# Patient Record
Sex: Male | Born: 1955
Health system: Southern US, Community
[De-identification: ages and names within clinical notes are randomized; demographics above are authoritative.]

## PROBLEM LIST (undated history)

## (undated) DIAGNOSIS — E7800 Pure hypercholesterolemia, unspecified: Secondary | ICD-10-CM

## (undated) DIAGNOSIS — K219 Gastro-esophageal reflux disease without esophagitis: Secondary | ICD-10-CM

## (undated) HISTORY — DX: Gastro-esophageal reflux disease without esophagitis: K21.9

## (undated) HISTORY — PX: OTHER SURGICAL HISTORY: SHX169

## (undated) HISTORY — DX: Pure hypercholesterolemia, unspecified: E78.00

---

## 2005-10-03 ENCOUNTER — Ambulatory Visit (HOSPITAL_COMMUNITY): Admission: RE | Admit: 2005-10-03 | Discharge: 2005-10-03 | Payer: Self-pay | Admitting: Orthopedic Surgery

## 2006-01-21 ENCOUNTER — Emergency Department (HOSPITAL_COMMUNITY): Admission: EM | Admit: 2006-01-21 | Discharge: 2006-01-21 | Payer: Self-pay | Admitting: Family Medicine

## 2006-10-28 ENCOUNTER — Ambulatory Visit (HOSPITAL_COMMUNITY): Admission: RE | Admit: 2006-10-28 | Discharge: 2006-10-28 | Payer: Self-pay | Admitting: Gastroenterology

## 2006-10-28 ENCOUNTER — Encounter (INDEPENDENT_AMBULATORY_CARE_PROVIDER_SITE_OTHER): Payer: Self-pay | Admitting: *Deleted

## 2007-06-30 ENCOUNTER — Encounter (INDEPENDENT_AMBULATORY_CARE_PROVIDER_SITE_OTHER): Payer: Self-pay | Admitting: Gastroenterology

## 2007-06-30 ENCOUNTER — Ambulatory Visit (HOSPITAL_COMMUNITY): Admission: RE | Admit: 2007-06-30 | Discharge: 2007-06-30 | Payer: Self-pay | Admitting: Gastroenterology

## 2008-08-18 ENCOUNTER — Ambulatory Visit (HOSPITAL_COMMUNITY): Admission: RE | Admit: 2008-08-18 | Discharge: 2008-08-18 | Payer: Self-pay | Admitting: General Surgery

## 2009-09-03 ENCOUNTER — Emergency Department (HOSPITAL_COMMUNITY): Admission: EM | Admit: 2009-09-03 | Discharge: 2009-09-03 | Payer: Self-pay | Admitting: Family Medicine

## 2011-04-30 NOTE — Op Note (Signed)
Jonathan Wallace, Jonathan Wallace               ACCOUNT NO.:  [id number]   MEDICAL RECORD NO.:  [id number]          PATIENT TYPE:  AMB   LOCATION:  SDS                          FACILITY:  [facility]   PHYSICIAN:  Lennie Muckle, MD      DATE OF BIRTH:  1956/11/11   DATE OF PROCEDURE:  08/18/2008  DATE OF DISCHARGE:  08/18/2008                               OPERATIVE REPORT   PREOPERATIVE DIAGNOSIS:  Left inguinal hernia.   POSTOPERATIVE DIAGNOSIS:  Left inguinal hernia.   PROCEDURE:  Laparoscopic left inguinal hernia repair.   SURGEON:  Amber L. Freida Busman, MD   ASSISTANT:  None.   ANESTHESIA:  General endotracheal.   FINDINGS:  A large indirect hernia.   COMPLICATIONS:  No immediate complications.   DRAINS:  No drains were placed.   INDICATIONS FOR PROCEDURE:  Jonathan Wallace is a 55 year old male who had had  bulging of his left groin with lifting weight.  He had burning-type of  pain with occasional sharp pain with coughing and sneezing.  He has to  refrain from heavy exertional activity and he has had some improvement  in his symptoms, but would like to have repair of his left inguinal  hernia.  Informed consent was obtained from the patient after explaining  risk of the procedure and offering both the laparoscopic and open  repair.  I discussed with the patient the risk included but not limited  to bleeding, infection, hernia recurrence, injury to spermatic cord and  vessels etc.  He was given reading material and informed consent was  obtained prior to the procedure.   DETAILS OF PROCEDURE:  Jonathan Wallace was identified in the preoperative  holding area.  He received 1 g of Kefzol and was taken to the operating  room.  He had his left groin marked by me preoperatively.  Once in the  operating room, he was placed in a supine position.  After  administration of general endotracheal anesthesia, a Foley catheter was  placed.  His abdomen and groin were prepped and draped in the usual  sterile  fashion.  A time-out procedure indicating the patient and  procedure were performed.  An incision was placed at the umbilicus after  anesthetizing the skin with 0.25% Marcaine.  I identified the anterior  rectus fascia.  This was incised with #1 blade.  I then retracted the  rectus muscle on the left laterally and finger dissected the  preperitoneal space.  The pacemaker plus device was then placed into  preperitoneal space.  Using a 30 degree 10-mm camera, the balloon was  insufflated in the pacemaker plus.  After adequately insufflating the  balloon, I waited approximately 2 minute for adequate capillary refill.  I then retracted the balloon slightly and refilled the balloon one more  time.  The balloon was then removed.  Laparoscopic port was secured  after insufflating the balloon on the port.  Two 5-mm ports were placed  in the midline under visualization with camera.  I then began dissecting  laterally using blunt dissectors on the right abdominal wall.  The  peritoneum  was densely adhered to the muscle on the right.  A small  retroperitoneum was occurred with dissection.  I did close it with a PDS  Endoloop.  I then proceeded my dissection medially down towards the  internal ring.  I dissected the peritoneum in the vicinity of the  internal ring and identified the vas deferens, as well as spermatic cord  vessels.  I continued dissecting the hernia sac away from the spermatic  cord and vessels.  This was able to be fully dissected away.  There was  a minimum amount of bleeding in the area.  I then pushed the peritoneum  and the sac away from the vas deferens and the vessels.  I then  carefully dissected around the area of the Cooper ligament by keeping  the internal iliac vessels in view.  I then placed 3 x 6 piece of  Ultrapro mesh into the preperitoneal space.  This was secured with the  ProTack device at [facility] ligament and secured it around the internal  ring and then laterally  by palpating the ProTack device.  There was some  escape of carbon dioxide into the abdominal cavity from the rent in the  peritoneum.  However, I placed the mesh in good position and allowed the  peritoneum to retract to insufflate around the mesh and holding the  inferior edges in position.  I then released the pneumoperitoneum,  attempted to remove as much gas from the abdominal cavity as possible.  I removed the port at the umbilical region.  The fascia was closed with  0 Vicryl suture.  Skin was closed with 4-0 Monocryl.  The patient was  extubated, has Foley catheter removed and he is transferred to  postanesthesia care unit in stable condition.  He will be discharged  home with Percocet, #45 dispensed.  He will follow up with me in  approximately 3 weeks' time.      Lennie Muckle, MD  Electronically Signed     ALA/MEDQ  D:  08/18/2008  T:  08/19/2008  Job:  [id number]   cc:   Tally Joe, M.D.

## 2011-04-30 NOTE — Op Note (Signed)
Jonathan Wallace, Jonathan Wallace               ACCOUNT NO.:  [id number]   MEDICAL RECORD NO.:  [id number]          PATIENT TYPE:  AMB   LOCATION:  ENDO                         FACILITY:  [facility]   PHYSICIAN:  Petra Kuba, M.D.    DATE OF BIRTH:  [birth year]   DATE OF PROCEDURE:  06/30/2007  DATE OF DISCHARGE:                               OPERATIVE REPORT   PROCEDURE:  Flex sigmoidoscopy with hot biopsy.   INDICATIONS:  Want to recheck a sessile rectal polyp which was removed  last year to make sure no residual.  Consent was signed after risks,  benefits, methods, options thoroughly discussed multiple times in the  past.  No sedation was done.   PROCEDURE:  Rectal inspection pertinent for a very small hemorrhoids.  Digital exam was negative.  The video pediatric colonoscope was  inserted.  The rectum was thoroughly evaluated.  No abnormality was seen  on straight visualization.  However, on retroflex visualization there  was possibly an area of residual polyp.  The scope was straightened, we  went ahead and advanced to 40 cm.  Other than a rare left-sided  diverticula, no other abnormalities were seen.  The scope was slowly  withdrawn back to the rectum and again was evaluated on straight and  retroflex visualization.  We could not see the area on straight  visualization; however, on retroflexion the small sessile probable  residual area was confirmed and we went ahead and hot biopsied it x2.  There was no signs of bleeding and he did not have any pain on this  maneuver.  The scope was straightened and straight visualization, we  could now see the cautery mark but no obvious residual polyp. Anorectal  pull-through did not reveal any additional findings.  We went ahead and  slowly advanced to the rectosigmoid junction.  Air was suctioned, scope  removed.  The patient tolerated the procedure well.  There was no  obvious immediate complication.   ENDOSCOPIC DIAGNOSIS:  1. Small internal external  hemorrhoids.  2. Questionable tiny residual just above the anorectal junction seen      on retroflexion only, status post hot biopsy x2.  3. Rare left-sided diverticula.  4. Otherwise within normal limits to 40 cm.   PLAN:  Await pathology.  Hold aspirin and nonsteroidals for a week, call  me p.r.n. otherwise depending on path will discuss repeat colon  screening and probably proceed with the sedated colonoscopy next time.           ______________________________  Petra Kuba, M.D.     MEM/MEDQ  D:  06/30/2007  T:  07/01/2007  Job:  [id number]   cc:   Royetta Crochet, MD  Fax: [phone]

## 2011-05-03 NOTE — Op Note (Signed)
NAMETREXTON, Jonathan Wallace               ACCOUNT NO.:  [id number]   MEDICAL RECORD NO.:  [id number]          PATIENT TYPE:  AMB   LOCATION:  ENDO                         FACILITY:  [facility]   PHYSICIAN:  Petra Kuba, M.D.    DATE OF BIRTH:  07-16-1956   DATE OF PROCEDURE:  10/28/2006  DATE OF DISCHARGE:                                 OPERATIVE REPORT   PROCEDURE:  Colonoscopy and polypectomy.   INDICATIONS:  Screening.  Consent was signed after risks, benefits, methods,  options thoroughly discussed prior to any premeds given and in the office  with my nurse.   MEDICINES USED:  Fentanyl 125 mcg, Versed 12.5 mg.   PROCEDURE:  Rectal inspection is pertinent for external hemorrhoids, small.  Digital exam was negative.  The video colonoscope was inserted and fairly  easily, despite tortuosity in both flexures, advanced to the cecum.  This  did require some abdominal pressure and rolling him on his back.  Other than  some scattered left-sided diverticula, no abnormalities were seen on  insertion.  The cecum was identified by the appendiceal orifice and  ileocecal valve.  In fact, the scope was inserted a short ways in the  terminal ileum which was normal.  Photo documentation was obtained.  The  scope was then slowly withdrawn.  The prep was adequate.  There was minimal  liquid stool that required washing and suctioning.  On slow withdrawal  through the colon, the cecum, ascending, transverse and majority of the  descending was normal.  He did have some scattered left-sided diverticula.  Once back in the rectum, there was a small semi-sessile very distal rectal  polyp which could be seen on both straight and retroflexed visualization.  We went ahead and did two snares of it carefully based on its proximity to  the hemorrhoidal line.  By the way on retroflexion and anorectal pull-  through he did have some small hemorrhoids.  We went ahead and carefully  snared it three times on straight  visualization.  Small pieces were  withdrawn and suctioned through the scope and collected in the trap, and one  time a different piece was snared on retroflexed visualization.  Electrocautery was applied and a small piece was withdrawn, suctioned  through the scope and collected in the trap.  The polyp seemed to be fairly  well removed, although based on its sessile nature difficult to say for sure  if completely removed.  The scope was straightened, readvanced a short ways  up the left side of the colon.  Air was suctioned, scope removed.  The  patient tolerated the procedure well.  There was no obvious immediate  complication.   ENDOSCOPIC DIAGNOSES:  1. Internal and external small hemorrhoids.  2. Very distal rectal semi-sessile polyp, status post multiple snares on      straight and retroflexed visualization.  3. A few left-sided diverticula.  4. Otherwise within normal limits to the terminal ileum.   PLAN:  Await pathology to determine future colonic screening.  Happy to see  back p.r.n.  Otherwise return to the care of  Dr. Stacie Acres for the customary  healthcare screening and maintenance.           ______________________________  Petra Kuba, M.D.     MEM/MEDQ  D:  10/28/2006  T:  10/28/2006  Job:  [id number]   cc:   Royetta Crochet, MD

## 2012-01-07 ENCOUNTER — Encounter: Payer: Self-pay | Admitting: Family Medicine

## 2012-01-07 ENCOUNTER — Ambulatory Visit (INDEPENDENT_AMBULATORY_CARE_PROVIDER_SITE_OTHER): Payer: 59 | Admitting: Family Medicine

## 2012-01-07 VITALS — BP 117/71 | HR 70 | Ht 71.0 in | Wt 200.0 lb

## 2012-01-07 DIAGNOSIS — M79673 Pain in unspecified foot: Secondary | ICD-10-CM

## 2012-01-07 DIAGNOSIS — M722 Plantar fascial fibromatosis: Secondary | ICD-10-CM

## 2012-01-07 DIAGNOSIS — M79609 Pain in unspecified limb: Secondary | ICD-10-CM

## 2012-01-07 NOTE — Progress Notes (Signed)
  Patient Name: Jonathan Wallace Date of Birth: 1956/12/08 Age: 56 y.o. Medical Record Number: [id number] Gender: male Date of Encounter: 01/07/2012  History of Present Illness:  Jonathan Wallace is a 56 y.o. very pleasant male patient who presents with the following:  Pleasant patient in no distress with history of PF syndrome. Intermittent foot pain, now doing OK. Did well with some old orthotics - 60 or 57 years old that are now broken down significantly. Has tried multiple different OTC foot modifications.   Past Medical History, Surgical History, Social History, Family History, Problem List, Medications, and Allergies have been reviewed and updated if relevant.  Review of Systems:  GEN: No fevers, chills. Nontoxic. Primarily MSK c/o today. MSK: Detailed in the HPI GI: tolerating PO intake without difficulty Neuro: No numbness, parasthesias, or tingling associated. Otherwise the pertinent positives of the ROS are noted above.    Physical Examination: Filed Vitals:   01/07/12 1457  BP: 117/71  Pulse: 70  Height: 5\' 11"  (1.803 m)  Weight: 200 lb (90.719 kg)    Body mass index is 27.89 kg/(m^2).   GEN: WDWN, NAD, Non-toxic, Alert & Oriented x 3 HEENT: Atraumatic, Normocephalic.  Ears and Nose: No external deformity. EXTR: No clubbing/cyanosis/edema NEURO: Normal gait.  PSYCH: Normally interactive. Conversant. Not depressed or anxious appearing.  Calm demeanor.   FEET: b Echymosis: no Edema: no ROM: full LE B Gait: heel toe, non-antalgic MT pain: no Callus pattern: none Lateral Mall: NT Medial Mall: NT Talus: NT Navicular: NT Cuboid: NT Calcaneous: NT Metatarsals: NT 5th MT: NT Phalanges: NT Achilles: NT Plantar Fascia: mild ttp Fat Pad: NT Peroneals: NT Post Tib: NT Great Toe: Nml motion Ant Drawer: neg ATFL: NT CFL: NT Deltoid: NT Other foot breakdown: none Long arch: mild breakdown Transverse arch: mild breakdown Hindfoot breakdown: none Sensation:  intact   Assessment and Plan: 1. Plantar fascia syndrome   2. Foot pain     Patient was fitted for a standard, cushioned, semi-rigid orthotic.  The orthotic was heated and the patient stood on the orthotic blank positioned on the orthotic stand. The patient was positioned in subtalar neutral position and 10 degrees of ankle dorsiflexion in a weight bearing stance. After molding, a stable [employer] EVA base was applied to the orthotic blank.   The blank was ground to a stable position for weight bearing. size:10 base: cork posting: none additional orthotic padding: none

## 2012-07-15 ENCOUNTER — Other Ambulatory Visit: Payer: Self-pay | Admitting: Gastroenterology

## 2012-12-16 DIAGNOSIS — I4510 Unspecified right bundle-branch block: Secondary | ICD-10-CM

## 2012-12-16 HISTORY — DX: Unspecified right bundle-branch block: I45.10

## 2018-04-24 ENCOUNTER — Ambulatory Visit
Admission: RE | Admit: 2018-04-24 | Discharge: 2018-04-24 | Disposition: A | Discharge status: Home / Self Care | Payer: BLUE CROSS/BLUE SHIELD | Source: Ambulatory Visit | Attending: Family Medicine | Admitting: Family Medicine

## 2018-04-24 ENCOUNTER — Other Ambulatory Visit: Payer: Self-pay | Admitting: Family Medicine

## 2018-04-24 DIAGNOSIS — R05 Cough: Secondary | ICD-10-CM

## 2018-04-24 DIAGNOSIS — R053 Chronic cough: Secondary | ICD-10-CM

## 2020-06-09 DIAGNOSIS — M238X1 Other internal derangements of right knee: Secondary | ICD-10-CM | POA: Diagnosis not present

## 2020-06-09 DIAGNOSIS — M2391 Unspecified internal derangement of right knee: Secondary | ICD-10-CM | POA: Diagnosis not present

## 2020-06-09 DIAGNOSIS — M25561 Pain in right knee: Secondary | ICD-10-CM | POA: Diagnosis not present

## 2020-07-28 DIAGNOSIS — Z20828 Contact with and (suspected) exposure to other viral communicable diseases: Secondary | ICD-10-CM | POA: Diagnosis not present

## 2020-08-14 DIAGNOSIS — R110 Nausea: Secondary | ICD-10-CM | POA: Diagnosis not present

## 2020-08-14 DIAGNOSIS — R142 Eructation: Secondary | ICD-10-CM | POA: Diagnosis not present

## 2020-08-14 DIAGNOSIS — R1013 Epigastric pain: Secondary | ICD-10-CM | POA: Diagnosis not present

## 2020-12-16 DIAGNOSIS — I700 Atherosclerosis of aorta: Secondary | ICD-10-CM

## 2020-12-16 HISTORY — DX: Atherosclerosis of aorta: I70.0

## 2021-01-09 DIAGNOSIS — K219 Gastro-esophageal reflux disease without esophagitis: Secondary | ICD-10-CM | POA: Diagnosis not present

## 2021-01-09 DIAGNOSIS — R110 Nausea: Secondary | ICD-10-CM | POA: Diagnosis not present

## 2021-02-20 DIAGNOSIS — K219 Gastro-esophageal reflux disease without esophagitis: Secondary | ICD-10-CM | POA: Diagnosis not present

## 2021-03-30 DIAGNOSIS — M75111 Incomplete rotator cuff tear or rupture of right shoulder, not specified as traumatic: Secondary | ICD-10-CM | POA: Diagnosis not present

## 2021-04-13 DIAGNOSIS — N529 Male erectile dysfunction, unspecified: Secondary | ICD-10-CM | POA: Diagnosis not present

## 2021-04-13 DIAGNOSIS — F3340 Major depressive disorder, recurrent, in remission, unspecified: Secondary | ICD-10-CM | POA: Diagnosis not present

## 2021-04-13 DIAGNOSIS — J309 Allergic rhinitis, unspecified: Secondary | ICD-10-CM | POA: Diagnosis not present

## 2021-04-13 DIAGNOSIS — E782 Mixed hyperlipidemia: Secondary | ICD-10-CM | POA: Diagnosis not present

## 2021-04-13 DIAGNOSIS — Z0000 Encounter for general adult medical examination without abnormal findings: Secondary | ICD-10-CM | POA: Diagnosis not present

## 2021-04-13 DIAGNOSIS — Z125 Encounter for screening for malignant neoplasm of prostate: Secondary | ICD-10-CM | POA: Diagnosis not present

## 2021-04-16 ENCOUNTER — Other Ambulatory Visit: Payer: Self-pay | Admitting: Family Medicine

## 2021-04-16 DIAGNOSIS — Z136 Encounter for screening for cardiovascular disorders: Secondary | ICD-10-CM

## 2021-04-18 DIAGNOSIS — H16223 Keratoconjunctivitis sicca, not specified as Sjogren's, bilateral: Secondary | ICD-10-CM | POA: Diagnosis not present

## 2021-04-18 DIAGNOSIS — H524 Presbyopia: Secondary | ICD-10-CM | POA: Diagnosis not present

## 2021-04-20 DIAGNOSIS — M75111 Incomplete rotator cuff tear or rupture of right shoulder, not specified as traumatic: Secondary | ICD-10-CM | POA: Diagnosis not present

## 2021-04-30 ENCOUNTER — Ambulatory Visit
Admission: RE | Admit: 2021-04-30 | Discharge: 2021-04-30 | Disposition: A | Discharge status: Home / Self Care | Payer: Medicare Other | Source: Ambulatory Visit | Attending: Family Medicine | Admitting: Family Medicine

## 2021-04-30 DIAGNOSIS — Z87891 Personal history of nicotine dependence: Secondary | ICD-10-CM | POA: Diagnosis not present

## 2021-04-30 DIAGNOSIS — Z136 Encounter for screening for cardiovascular disorders: Secondary | ICD-10-CM | POA: Diagnosis not present

## 2021-05-16 DIAGNOSIS — M25511 Pain in right shoulder: Secondary | ICD-10-CM | POA: Diagnosis not present

## 2021-05-28 DIAGNOSIS — J209 Acute bronchitis, unspecified: Secondary | ICD-10-CM | POA: Diagnosis not present

## 2021-06-04 DIAGNOSIS — M75111 Incomplete rotator cuff tear or rupture of right shoulder, not specified as traumatic: Secondary | ICD-10-CM | POA: Diagnosis not present

## 2021-06-28 ENCOUNTER — Ambulatory Visit: Payer: Medicare Other | Admitting: Cardiovascular Disease

## 2021-06-28 ENCOUNTER — Other Ambulatory Visit: Payer: Self-pay

## 2021-06-28 ENCOUNTER — Encounter: Payer: Self-pay | Admitting: Cardiovascular Disease

## 2021-06-28 VITALS — BP 130/84 | HR 62 | Ht 71.0 in | Wt 191.8 lb

## 2021-06-28 DIAGNOSIS — I700 Atherosclerosis of aorta: Secondary | ICD-10-CM

## 2021-06-28 DIAGNOSIS — E7800 Pure hypercholesterolemia, unspecified: Secondary | ICD-10-CM | POA: Diagnosis not present

## 2021-06-28 NOTE — Progress Notes (Signed)
Cardiology Office Note:    Date:  06/28/2021   ID:  Jonathan Wallace, DOB 12/31/1955, MRN [id number]  PCP:  Tally Joe, MD   [facility] Providers Cardiologist:  None     Referring MD: Tally Joe, MD   Chief Complaint  Patient presents with   Aortic atherosclerosis  Jonathan Wallace is a 65 y.o. male who is being seen today for the evaluation of asymptomatic aortic atherosclerosis detected during screening for AAA at the request of Tally Joe, MD.   History of Present Illness:    Jonathan Wallace is a 65 y.o. male with a hx of generally good health, borderline hypercholesterolemia, longstanding asymptomatic right bundle branch block, recurrent depression.  Due to his remote history of smoking (smoked half a pack a day for 20 years, quit 25 years ago) he underwent screening with a ultrasound for AAA.  The abdominal aorta was normal in caliber, but atherosclerosis was seen "throughout" the vessel.  He is therefore referred for evaluation for vascular disease.    In 2016 a right bundle branch block was incidentally noted on electrocardiography and he underwent work-up with an ECG stress test, which was normal.  He has always had borderline elevated cholesterol (total cholesterol around 200, LDL around 130, but with normal triglycerides and good HDL level (around 60).  He took Lipitor for a short period of time, following the diagnosis of RBBB, but has not been on medication since 2018.  The patient specifically denies any chest pain at rest exertion, dyspnea at rest or with exertion, orthopnea, paroxysmal nocturnal dyspnea, syncope, palpitations, focal neurological deficits, intermittent claudication, lower extremity edema, unexplained weight gain, cough, hemoptysis or wheezing.  He exercises regularly lifting weights at the gym, walking and hiking or using the treadmill.  He estimates that he gets a good 6-8 hours of exercise in a week.    Past Medical History:  Diagnosis Date    Abdominal aortic atherosclerosis (HCC) 2022   Borderline hypercholesterolemia    GERD (gastroesophageal reflux disease)    RBBB 2014    Past Surgical History:  Procedure Laterality Date   Right inguinal hernia repair Right     Current Medications: No outpatient medications have been marked as taking for the 06/28/21 encounter (Office Visit) with Thurmon Fair, MD.     Allergies:   Patient has no known allergies.   Social History   Socioeconomic History   Marital status: Married    Spouse name: Not on file   Number of children: Not on file   Years of education: Not on file   Highest education level: Not on file  Occupational History   Not on file  Tobacco Use   Smoking status: Former    Types: Cigarettes    Quit date: 12/16/1997    Years since quitting: 23.5   Smokeless tobacco: Never  Substance and Sexual Activity   Alcohol use: Not on file   Drug use: Not on file   Sexual activity: Not on file  Other Topics Concern   Not on file  Social History Narrative   Not on file   Social Determinants of Health   Financial Resource Strain: Not on file  Food Insecurity: Not on file  Transportation Needs: Not on file  Physical Activity: Not on file  Stress: Not on file  Social Connections: Not on file     Family History: The patient's family history significantly negative for early onset CAD or other cardiac illness.  His father did have a  stroke at age 37 and had PAD.  His mother had hypertension.  ROS:   Please see the history of present illness.    All other systems reviewed and are negative.  EKGs/Labs/Other Studies Reviewed:    The following studies were reviewed today: Abdominal aortic ultrasound 04/30/2021 IMPRESSION: Atherosclerotic plaque seen throughout the abdominal aorta without aneurysmal dilatation. EKG:  EKG is ordered today.  The ekg ordered today demonstrates sinus rhythm, right bundle branch block and left anterior fascicular block, no ischemic  changes, QTC 450 ms  Recent Labs: No results found for requested labs within last 8760 hours.  04/14/2021 Hemoglobin 14.5, creatinine 0.81, potassium 4.8, normal liver function tests Recent Lipid Panel No results found for: CHOL, TRIG, HDL, CHOLHDL, VLDL, LDLCALC, LDLDIRECT 04/14/2021  cholesterol 200, triglycerides 100, HDL 58, LDL 125  Risk Assessment/Calculations:     Physical Exam:    VS:  BP 130/84   Pulse 62   Ht 5\' 11"  (1.803 m)   Wt 191 lb 12.8 oz (87 kg)   SpO2 97%   BMI 26.75 kg/m     Wt Readings from Last 3 Encounters:  06/28/21 191 lb 12.8 oz (87 kg)  01/07/12 200 lb (90.7 kg)     GEN: Mildly overweight. Well nourished, well developed in no acute distress HEENT: Normal NECK: No JVD; No carotid bruits LYMPHATICS: No lymphadenopathy CARDIAC: RRR, no murmurs, rubs, gallops RESPIRATORY:  Clear to auscultation without rales, wheezing or rhonchi  ABDOMEN: Soft, non-tender, non-distended MUSCULOSKELETAL:  No edema; No deformity  SKIN: Warm and dry NEUROLOGIC:  Alert and oriented x 3 PSYCHIATRIC:  Normal affect   ASSESSMENT:    1. Aortic atherosclerosis (HCC)   2. Borderline hypercholesterolemia    PLAN:    In order of problems listed above:  Aortic atherosclerosis: Normal caliber of the aorta.  Serial studies are not indicated.  However this may be a marker of more extensive atherosclerosis and possible need for more aggressive lipid risk factor management. Hypercholesterolemia: Hard to quantify his atherosclerotic/vascular risk based on the abdominal ultrasound scan.  His LDL cholesterol is just under the threshold where we typically discuss lipid-lowering therapy in an otherwise asymptomatic patient without CAD/PAD.  We will schedule him for coronary calcium scoring.  If his coronary calcium score is elevated then I think he should receive lipid-lowering therapy to achieve a target LDL of less than 100, preferably less than 70.  If however his coronary  calcium score is low.  The focus should be on weight loss, continued physical exercise and healthy diet alone.  He does not have any other coronary risk factors since he quit smoking 25 years ago.        Medication Adjustments/Labs and Tests Ordered: Current medicines are reviewed at length with the patient today.  Concerns regarding medicines are outlined above.  Orders Placed This Encounter  Procedures   CT CARDIAC SCORING (SELF PAY ONLY)   EKG 12-Lead   No orders of the defined types were placed in this encounter.   Patient Instructions  Medication Instructions:  No changes *If you need a refill on your cardiac medications before your next appointment, please call your pharmacy*   Lab Work: None ordered If you have labs (blood work) drawn today and your tests are completely normal, you will receive your results only by: MyChart Message (if you have MyChart) OR A paper copy in the mail If you have any lab test that is abnormal or we need to change your treatment,  we will call you to review the results.   Testing/Procedures: Dr. Royann Shiversroitoru has ordered a CT coronary calcium score. This test is done at [address]. This is $99 out of pocket.   Coronary CalciumScan A coronary calcium scan is an imaging test used to look for deposits of calcium and other fatty materials (plaques) in the inner lining of the blood vessels of the heart (coronary arteries). These deposits of calcium and plaques can partly clog and narrow the coronary arteries without producing any symptoms or warning signs. This puts a person at risk for a heart attack. This test can detect these deposits before symptoms develop. Tell a health care provider about: Any allergies you have. All medicines you are taking, including vitamins, herbs, eye drops, creams, and over-the-counter medicines. Any problems you or family members have had with anesthetic medicines. Any blood disorders you have. Any  surgeries you have had. Any medical conditions you have. Whether you are pregnant or may be pregnant. What are the risks? Generally, this is a safe procedure. However, problems may occur, including: Harm to a pregnant woman and her unborn baby. This test involves the use of radiation. Radiation exposure can be dangerous to a pregnant woman and her unborn baby. If you are pregnant, you generally should not have this procedure done. Slight increase in the risk of cancer. This is because of the radiation involved in the test. What happens before the procedure? No preparation is needed for this procedure. What happens during the procedure? You will undress and remove any jewelry around your neck or chest. You will put on a hospital gown. Sticky electrodes will be placed on your chest. The electrodes will be connected to an electrocardiogram (ECG) machine to record a tracing of the electrical activity of your heart. A CT scanner will take pictures of your heart. During this time, you will be asked to lie still and hold your breath for 2-3 seconds while a picture of your heart is being taken. The procedure may vary among health care providers and hospitals. What happens after the procedure? You can get dressed. You can return to your normal activities. It is up to you to get the results of your test. Ask your health care provider, or the department that is doing the test, when your results will be ready. Summary A coronary calcium scan is an imaging test used to look for deposits of calcium and other fatty materials (plaques) in the inner lining of the blood vessels of the heart (coronary arteries). Generally, this is a safe procedure. Tell your health care provider if you are pregnant or may be pregnant. No preparation is needed for this procedure. A CT scanner will take pictures of your heart. You can return to your normal activities after the scan is done. This information is not intended to  replace advice given to you by your health care provider. Make sure you discuss any questions you have with your health care provider. Document Released: 05/30/2008 Document Revised: 10/21/2016 Document Reviewed: 10/21/2016 Elsevier Interactive Patient Education  2017 [employer]    Follow-Up: At [facility], you and your health needs are our priority.  As part of our continuing mission to provide you with exceptional heart care, we have created designated Provider Care Teams.  These Care Teams include your primary Cardiologist (physician) and Advanced Practice Providers (APPs -  Physician Assistants and Nurse Practitioners) who all work together to provide you with the care you need,  when you need it.  We recommend signing up for the patient portal called "MyChart".  Sign up information is provided on this After Visit Summary.  MyChart is used to connect with patients for Virtual Visits (Telemedicine).  Patients are able to view lab/test results, encounter notes, upcoming appointments, etc.  Non-urgent messages can be sent to your provider as well.   To learn more about what you can do with MyChart, go to [email or web address].    Your next appointment:   Follow up as needed with Dr. Royann Shivers   Signed, Thurmon Fair, MD  06/28/2021 5:22 PM    [facility]

## 2021-06-28 NOTE — Patient Instructions (Signed)
Medication Instructions:  No changes *If you need a refill on your cardiac medications before your next appointment, please call your pharmacy*   Lab Work: None ordered If you have labs (blood work) drawn today and your tests are completely normal, you will receive your results only by: MyChart Message (if you have MyChart) OR A paper copy in the mail If you have any lab test that is abnormal or we need to change your treatment, we will call you to review the results.   Testing/Procedures: Dr. Croitoru has ordered a CT coronary calcium score. This test is done at [address]. This is $99 out of pocket.   Coronary CalciumScan A coronary calcium scan is an imaging test used to look for deposits of calcium and other fatty materials (plaques) in the inner lining of the blood vessels of the heart (coronary arteries). These deposits of calcium and plaques can partly clog and narrow the coronary arteries without producing any symptoms or warning signs. This puts a person at risk for a heart attack. This test can detect these deposits before symptoms develop. Tell a health care provider about: Any allergies you have. All medicines you are taking, including vitamins, herbs, eye drops, creams, and over-the-counter medicines. Any problems you or family members have had with anesthetic medicines. Any blood disorders you have. Any surgeries you have had. Any medical conditions you have. Whether you are pregnant or may be pregnant. What are the risks? Generally, this is a safe procedure. However, problems may occur, including: Harm to a pregnant woman and her unborn baby. This test involves the use of radiation. Radiation exposure can be dangerous to a pregnant woman and her unborn baby. If you are pregnant, you generally should not have this procedure done. Slight increase in the risk of cancer. This is because of the radiation involved in the test. What happens before the  procedure? No preparation is needed for this procedure. What happens during the procedure? You will undress and remove any jewelry around your neck or chest. You will put on a hospital gown. Sticky electrodes will be placed on your chest. The electrodes will be connected to an electrocardiogram (ECG) machine to record a tracing of the electrical activity of your heart. A CT scanner will take pictures of your heart. During this time, you will be asked to lie still and hold your breath for 2-3 seconds while a picture of your heart is being taken. The procedure may vary among health care providers and hospitals. What happens after the procedure? You can get dressed. You can return to your normal activities. It is up to you to get the results of your test. Ask your health care provider, or the department that is doing the test, when your results will be ready. Summary A coronary calcium scan is an imaging test used to look for deposits of calcium and other fatty materials (plaques) in the inner lining of the blood vessels of the heart (coronary arteries). Generally, this is a safe procedure. Tell your health care provider if you are pregnant or may be pregnant. No preparation is needed for this procedure. A CT scanner will take pictures of your heart. You can return to your normal activities after the scan is done. This information is not intended to replace advice given to you by your health care provider. Make sure you discuss any questions you have with your health care provider. Document Released: 05/30/2008 Document Revised: 10/21/2016 Document Reviewed: 10/21/2016   Elsevier Interactive Patient Education  2017 Elsevier Inc.    Follow-Up: At [facility], you and your health needs are our priority.  As part of our continuing mission to provide you with exceptional heart care, we have created designated Provider Care Teams.  These Care Teams include your primary Cardiologist (physician) and  Advanced Practice Providers (APPs -  Physician Assistants and Nurse Practitioners) who all work together to provide you with the care you need, when you need it.  We recommend signing up for the patient portal called "MyChart".  Sign up information is provided on this After Visit Summary.  MyChart is used to connect with patients for Virtual Visits (Telemedicine).  Patients are able to view lab/test results, encounter notes, upcoming appointments, etc.  Non-urgent messages can be sent to your provider as well.   To learn more about what you can do with MyChart, go to [email or web address].    Your next appointment:   Follow up as needed with Dr. Croitoru  

## 2021-07-11 ENCOUNTER — Ambulatory Visit (INDEPENDENT_AMBULATORY_CARE_PROVIDER_SITE_OTHER)
Admission: RE | Admit: 2021-07-11 | Discharge: 2021-07-11 | Disposition: A | Discharge status: Home / Self Care | Payer: Self-pay | Source: Ambulatory Visit | Attending: Cardiovascular Disease | Admitting: Cardiovascular Disease

## 2021-07-11 ENCOUNTER — Other Ambulatory Visit: Payer: Self-pay

## 2021-07-11 DIAGNOSIS — I700 Atherosclerosis of aorta: Secondary | ICD-10-CM

## 2021-07-13 ENCOUNTER — Encounter: Payer: Self-pay | Admitting: Cardiovascular Disease

## 2021-07-13 NOTE — Telephone Encounter (Signed)
    Pt is returning call to get CT result 

## 2021-07-13 NOTE — Telephone Encounter (Signed)
Left message to call back  

## 2021-07-18 NOTE — Telephone Encounter (Signed)
This encounter was created in error - please disregard.

## 2021-07-26 ENCOUNTER — Other Ambulatory Visit: Payer: Self-pay | Admitting: *Deleted

## 2021-07-26 MED ORDER — ATORVASTATIN CALCIUM 20 MG PO TABS: 20.0000 mg | ORAL_TABLET | Freq: Every day | ORAL | 3 refills | Status: AC

## 2021-09-18 DIAGNOSIS — M19011 Primary osteoarthritis, right shoulder: Secondary | ICD-10-CM | POA: Diagnosis not present

## 2021-09-18 DIAGNOSIS — M7541 Impingement syndrome of right shoulder: Secondary | ICD-10-CM | POA: Diagnosis not present

## 2021-09-18 DIAGNOSIS — S43431A Superior glenoid labrum lesion of right shoulder, initial encounter: Secondary | ICD-10-CM | POA: Diagnosis not present

## 2021-09-18 DIAGNOSIS — S46011A Strain of muscle(s) and tendon(s) of the rotator cuff of right shoulder, initial encounter: Secondary | ICD-10-CM | POA: Diagnosis not present

## 2021-09-18 DIAGNOSIS — G8918 Other acute postprocedural pain: Secondary | ICD-10-CM | POA: Diagnosis not present

## 2021-09-25 DIAGNOSIS — M25611 Stiffness of right shoulder, not elsewhere classified: Secondary | ICD-10-CM | POA: Diagnosis not present

## 2021-09-25 DIAGNOSIS — M25511 Pain in right shoulder: Secondary | ICD-10-CM | POA: Diagnosis not present

## 2021-10-03 DIAGNOSIS — M25511 Pain in right shoulder: Secondary | ICD-10-CM | POA: Diagnosis not present

## 2021-10-03 DIAGNOSIS — M25611 Stiffness of right shoulder, not elsewhere classified: Secondary | ICD-10-CM | POA: Diagnosis not present

## 2021-10-03 DIAGNOSIS — Z4789 Encounter for other orthopedic aftercare: Secondary | ICD-10-CM | POA: Diagnosis not present

## 2021-10-09 DIAGNOSIS — M25511 Pain in right shoulder: Secondary | ICD-10-CM | POA: Diagnosis not present

## 2021-10-09 DIAGNOSIS — M25611 Stiffness of right shoulder, not elsewhere classified: Secondary | ICD-10-CM | POA: Diagnosis not present

## 2021-10-15 DIAGNOSIS — M25611 Stiffness of right shoulder, not elsewhere classified: Secondary | ICD-10-CM | POA: Diagnosis not present

## 2021-10-15 DIAGNOSIS — M25511 Pain in right shoulder: Secondary | ICD-10-CM | POA: Diagnosis not present

## 2021-10-22 DIAGNOSIS — M25611 Stiffness of right shoulder, not elsewhere classified: Secondary | ICD-10-CM | POA: Diagnosis not present

## 2021-10-22 DIAGNOSIS — M25511 Pain in right shoulder: Secondary | ICD-10-CM | POA: Diagnosis not present

## 2021-10-29 DIAGNOSIS — M25511 Pain in right shoulder: Secondary | ICD-10-CM | POA: Diagnosis not present

## 2021-10-29 DIAGNOSIS — M25611 Stiffness of right shoulder, not elsewhere classified: Secondary | ICD-10-CM | POA: Diagnosis not present

## 2021-11-01 DIAGNOSIS — M25611 Stiffness of right shoulder, not elsewhere classified: Secondary | ICD-10-CM | POA: Diagnosis not present

## 2021-11-01 DIAGNOSIS — M25511 Pain in right shoulder: Secondary | ICD-10-CM | POA: Diagnosis not present

## 2021-11-05 DIAGNOSIS — M25611 Stiffness of right shoulder, not elsewhere classified: Secondary | ICD-10-CM | POA: Diagnosis not present

## 2021-11-05 DIAGNOSIS — M25511 Pain in right shoulder: Secondary | ICD-10-CM | POA: Diagnosis not present

## 2021-11-07 DIAGNOSIS — M25511 Pain in right shoulder: Secondary | ICD-10-CM | POA: Diagnosis not present

## 2021-11-07 DIAGNOSIS — M25611 Stiffness of right shoulder, not elsewhere classified: Secondary | ICD-10-CM | POA: Diagnosis not present

## 2021-11-12 DIAGNOSIS — M25511 Pain in right shoulder: Secondary | ICD-10-CM | POA: Diagnosis not present

## 2021-11-12 DIAGNOSIS — M25611 Stiffness of right shoulder, not elsewhere classified: Secondary | ICD-10-CM | POA: Diagnosis not present

## 2021-11-15 DIAGNOSIS — M25511 Pain in right shoulder: Secondary | ICD-10-CM | POA: Diagnosis not present

## 2021-11-15 DIAGNOSIS — M25611 Stiffness of right shoulder, not elsewhere classified: Secondary | ICD-10-CM | POA: Diagnosis not present

## 2021-11-19 DIAGNOSIS — M25611 Stiffness of right shoulder, not elsewhere classified: Secondary | ICD-10-CM | POA: Diagnosis not present

## 2021-11-19 DIAGNOSIS — M25511 Pain in right shoulder: Secondary | ICD-10-CM | POA: Diagnosis not present

## 2021-11-21 DIAGNOSIS — M25511 Pain in right shoulder: Secondary | ICD-10-CM | POA: Diagnosis not present

## 2021-11-21 DIAGNOSIS — M25611 Stiffness of right shoulder, not elsewhere classified: Secondary | ICD-10-CM | POA: Diagnosis not present

## 2021-11-26 DIAGNOSIS — M25611 Stiffness of right shoulder, not elsewhere classified: Secondary | ICD-10-CM | POA: Diagnosis not present

## 2021-11-26 DIAGNOSIS — M25511 Pain in right shoulder: Secondary | ICD-10-CM | POA: Diagnosis not present

## 2021-11-30 DIAGNOSIS — M25611 Stiffness of right shoulder, not elsewhere classified: Secondary | ICD-10-CM | POA: Diagnosis not present

## 2021-11-30 DIAGNOSIS — M25511 Pain in right shoulder: Secondary | ICD-10-CM | POA: Diagnosis not present

## 2021-12-04 DIAGNOSIS — M25611 Stiffness of right shoulder, not elsewhere classified: Secondary | ICD-10-CM | POA: Diagnosis not present

## 2021-12-04 DIAGNOSIS — M25511 Pain in right shoulder: Secondary | ICD-10-CM | POA: Diagnosis not present

## 2021-12-06 DIAGNOSIS — M25611 Stiffness of right shoulder, not elsewhere classified: Secondary | ICD-10-CM | POA: Diagnosis not present

## 2021-12-06 DIAGNOSIS — M25511 Pain in right shoulder: Secondary | ICD-10-CM | POA: Diagnosis not present

## 2021-12-11 DIAGNOSIS — M25511 Pain in right shoulder: Secondary | ICD-10-CM | POA: Diagnosis not present

## 2021-12-11 DIAGNOSIS — M25611 Stiffness of right shoulder, not elsewhere classified: Secondary | ICD-10-CM | POA: Diagnosis not present

## 2021-12-13 DIAGNOSIS — M25611 Stiffness of right shoulder, not elsewhere classified: Secondary | ICD-10-CM | POA: Diagnosis not present

## 2021-12-13 DIAGNOSIS — M25511 Pain in right shoulder: Secondary | ICD-10-CM | POA: Diagnosis not present

## 2021-12-21 DIAGNOSIS — M25611 Stiffness of right shoulder, not elsewhere classified: Secondary | ICD-10-CM | POA: Diagnosis not present

## 2021-12-21 DIAGNOSIS — M25511 Pain in right shoulder: Secondary | ICD-10-CM | POA: Diagnosis not present

## 2021-12-28 DIAGNOSIS — M25611 Stiffness of right shoulder, not elsewhere classified: Secondary | ICD-10-CM | POA: Diagnosis not present

## 2021-12-28 DIAGNOSIS — M25511 Pain in right shoulder: Secondary | ICD-10-CM | POA: Diagnosis not present

## 2022-02-16 IMAGING — US US ABDOMINAL AORTA SCREENING AAA
1 series · 14 of 14 positions shown · non-contrast
Comparison: None.

CLINICAL DATA: Male between 65-75 years of age with a smoking
history.

EXAM:
US ABDOMINAL AORTA MEDICARE SCREENING
TECHNIQUE: Ultrasound examination of the abdominal aorta was performed as a
screening evaluation for abdominal aortic aneurysm.

[Series 1: us abdominal aorta screening aaa · 0.28mm/px · 14 of 14 slices shown]
[im 1/14]
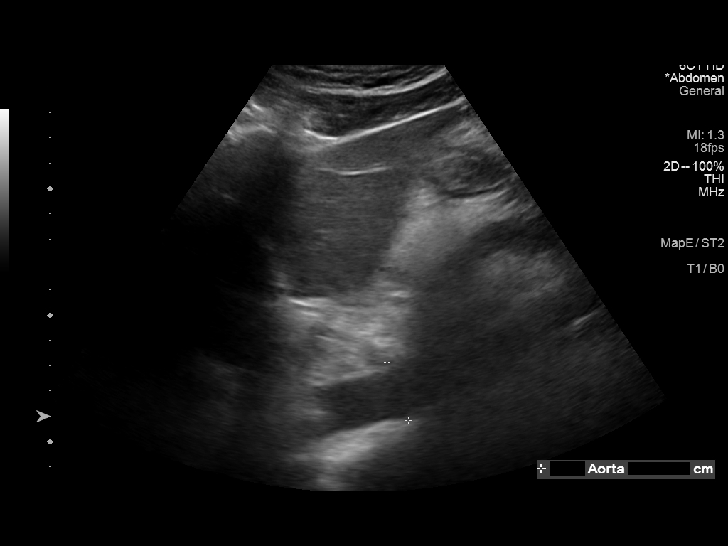
[im 2/14]
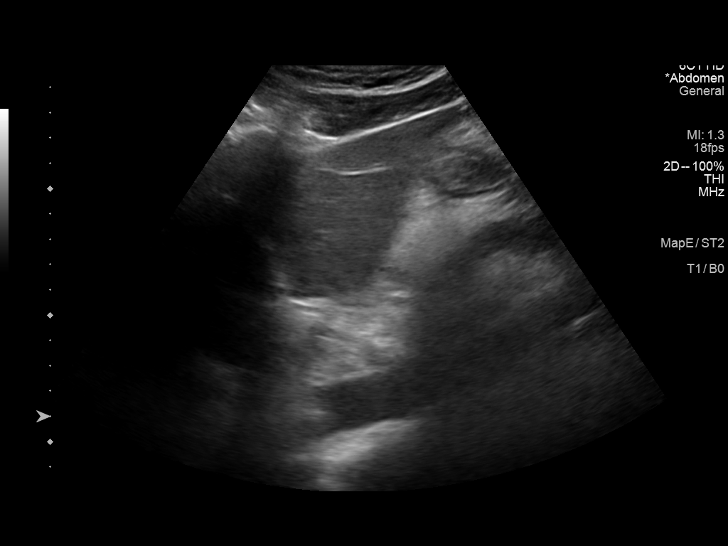
[im 3/14]
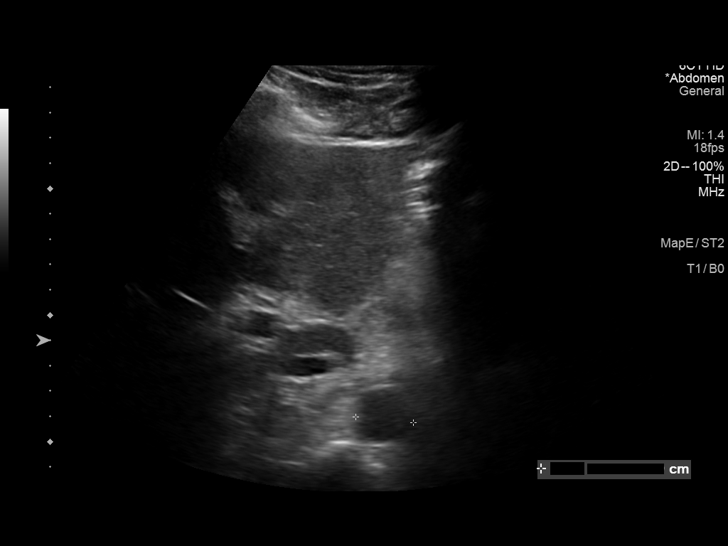
[im 4/14]
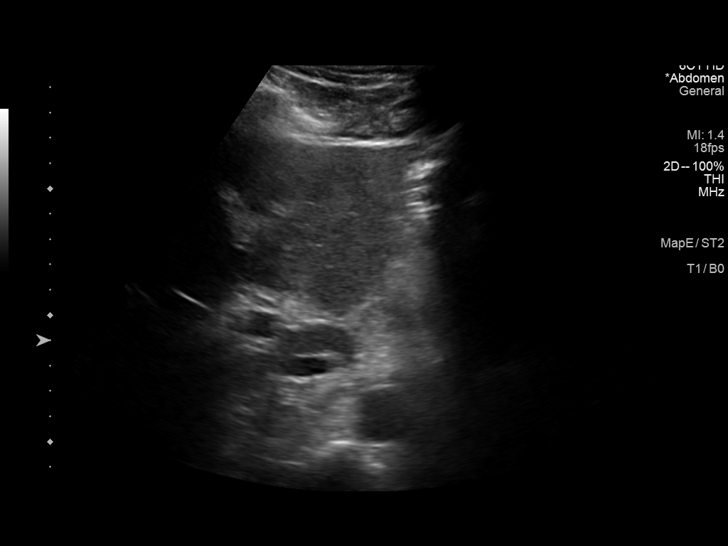
[im 5/14]
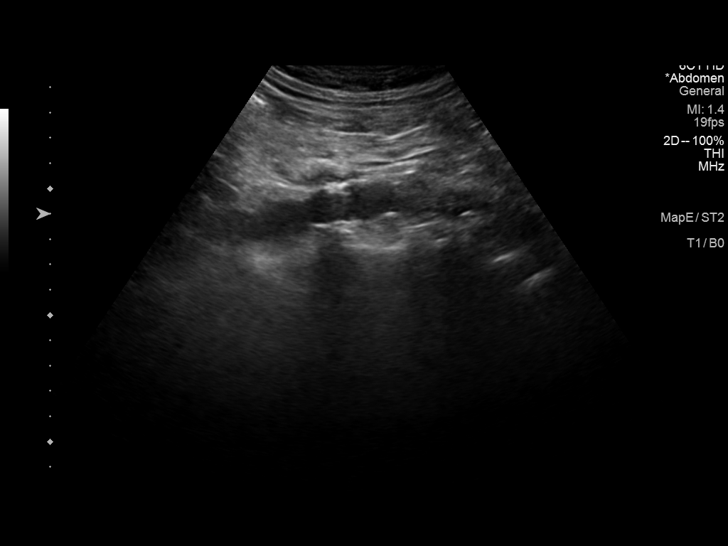
[im 6/14]
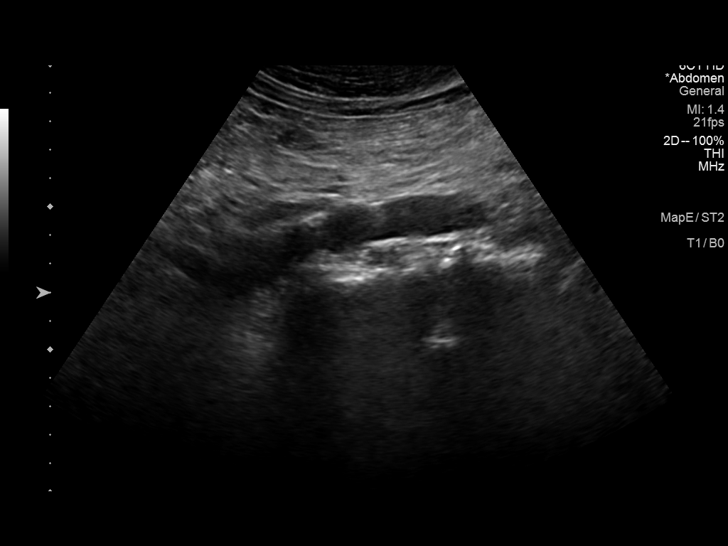
[im 7/14]
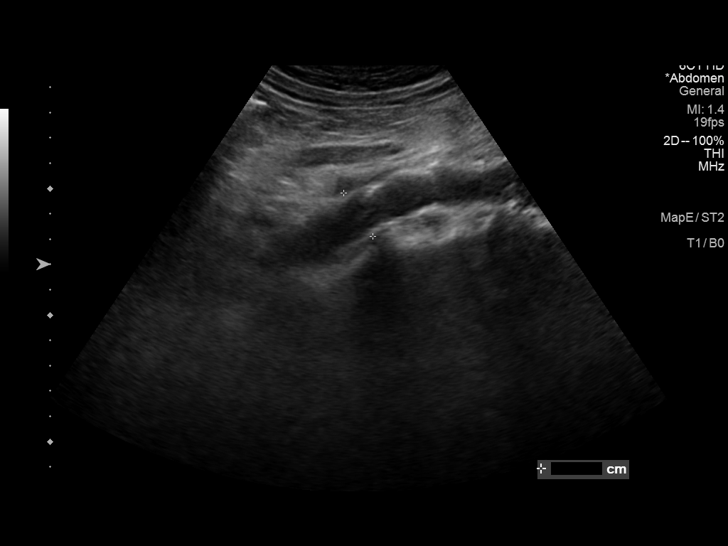
[im 8/14]
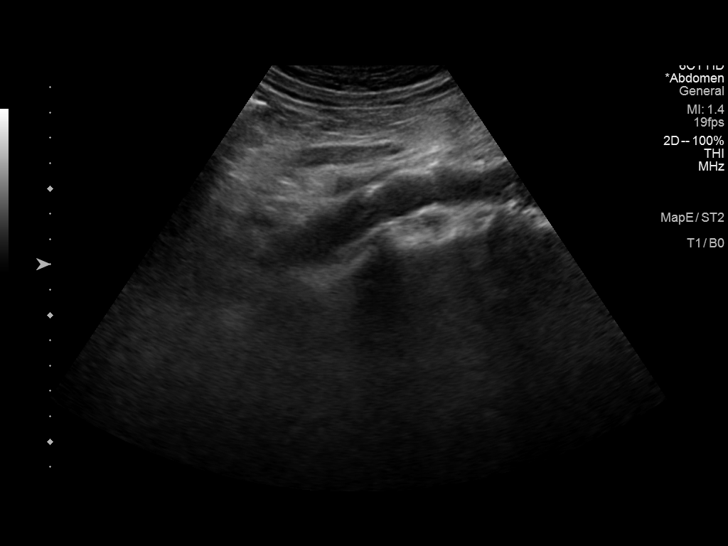
[im 9/14]
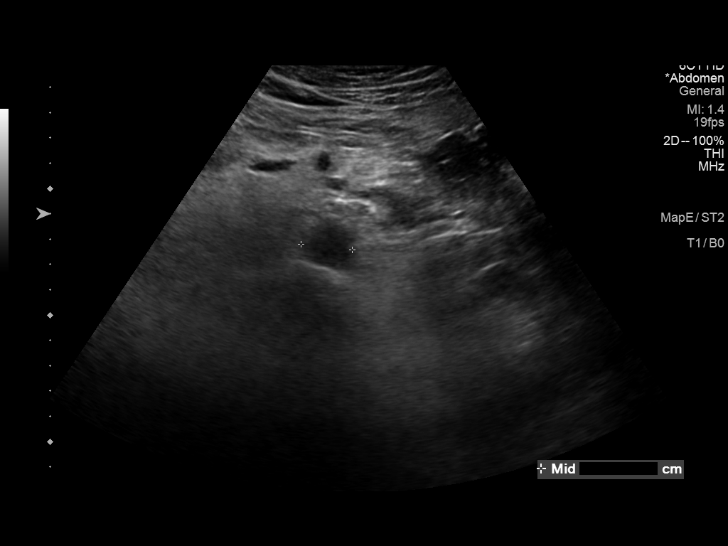
[im 10/14]
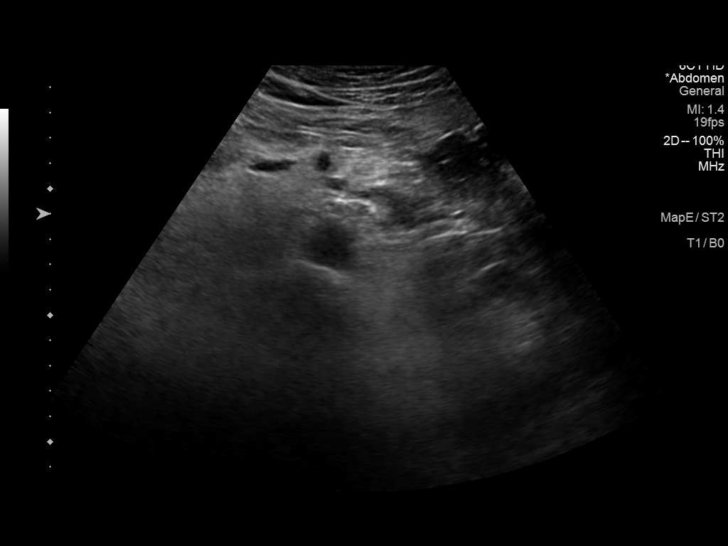
[im 11/14]
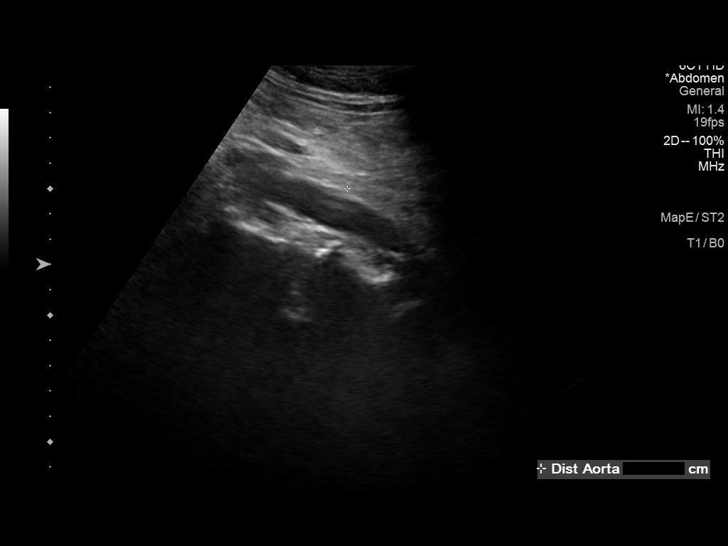
[im 12/14]
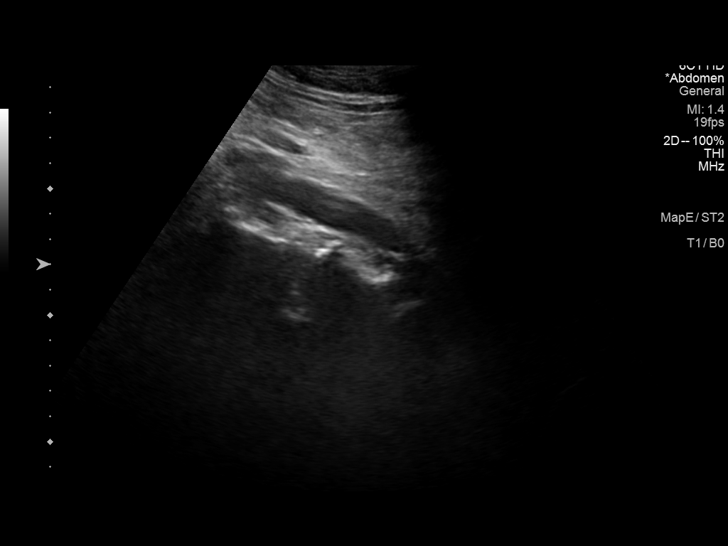
[im 13/14]
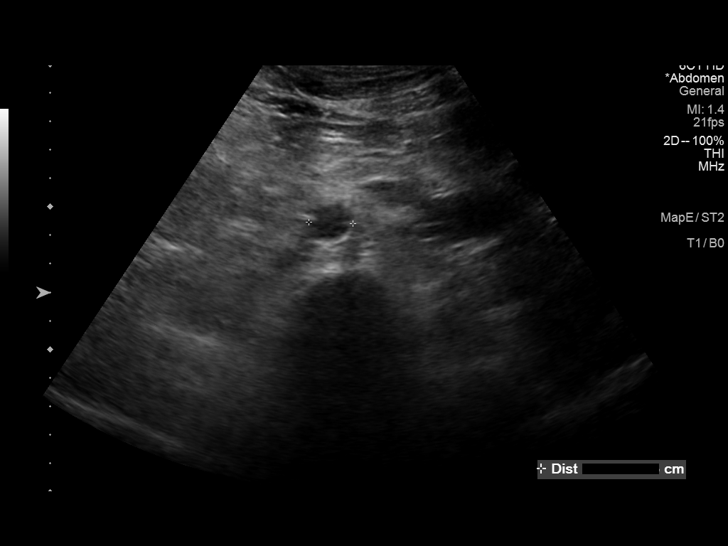
[im 14/14]
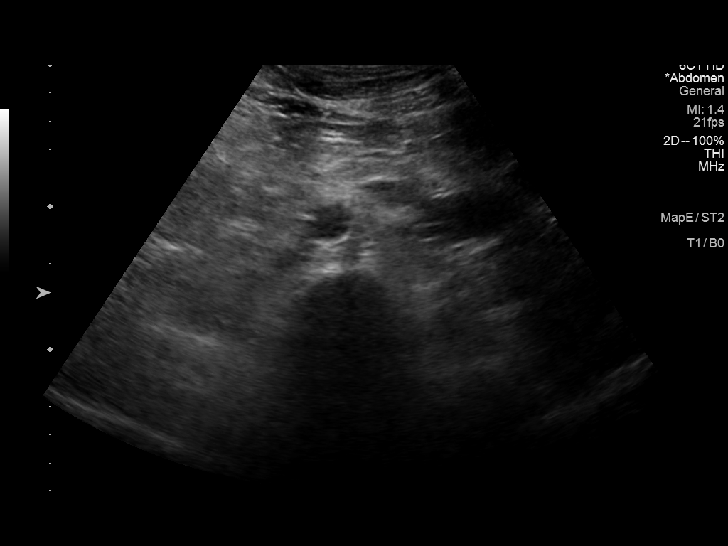

[14 of 14 positions shown; findings below may reference images not displayed]

FINDINGS: Abdominal aortic measurements as follows:

Proximal:  2.5 x 2.3 cm

Mid:  2.1 x 2.0 cm

Distal:  1.9 x 1.6 cm
IMPRESSION: Atherosclerotic plaque seen throughout the abdominal aorta without
aneurysmal dilatation.

## 2022-04-29 IMAGING — CT CT CARDIAC CORONARY ARTERY CALCIUM SCORE
3 series · 14 of 20 positions shown, 15 images · non-contrast
Comparison: 04/24/2018 chest radiograph
COMPARISON: 04/24/2018 chest radiograph

Addendum:
EXAM:
OVER-READ INTERPRETATION  CT CHEST

The following report is an over-read performed by radiologist Dr.
Suzan Jadhav [REDACTED] on 07/11/2021. This over-read
does not include interpretation of cardiac or coronary anatomy or
pathology. The calcium score interpretation by the cardiologist is
attached.
CLINICAL DATA: Cardiovascular Disease Risk stratification
Coronary Calcium Score
TECHNIQUE: A gated, non-contrast computed tomography scan of the heart was
performed using 3mm slice thickness. Axial images were analyzed on a
dedicated workstation. Calcium scoring of the coronary arteries was
performed using the Agatston method.

[Series 2: casc 3.0 bv41 2 bestdiast 73 % · axial · 0.45mm/px · z∈[-222,-147]mm · 4 of 43 slices shown, 5 images]
[im 9/43  vessel]
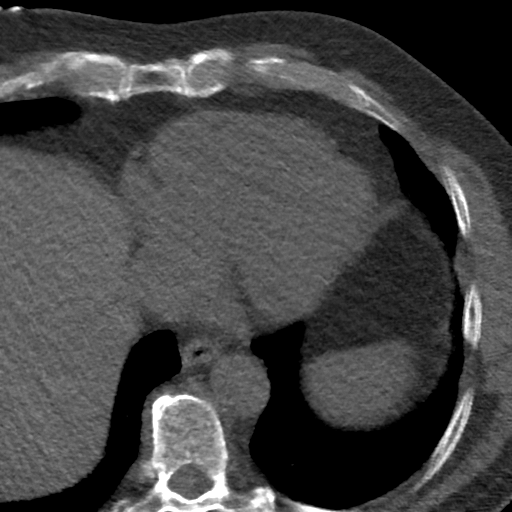
[im 9/43  lung]
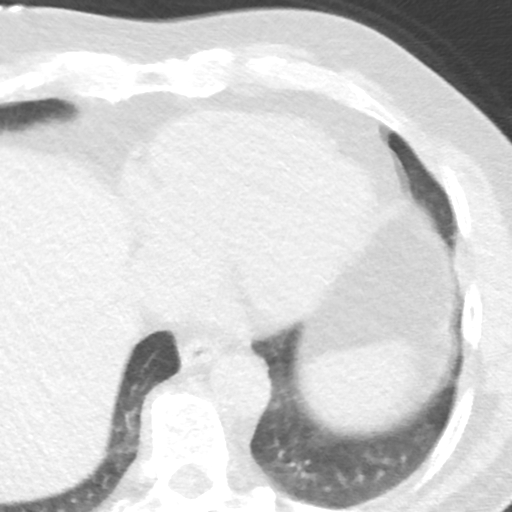
[im 17/43  vessel]
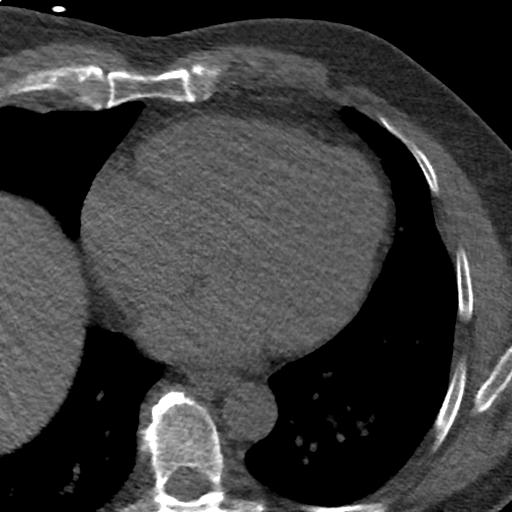
[im 26/43  vessel]
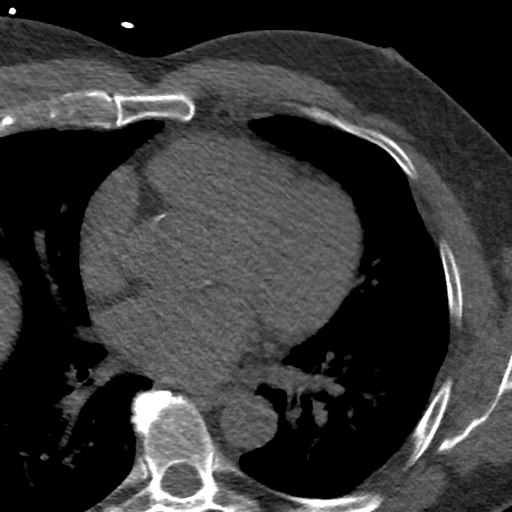
[im 34/43  vessel]
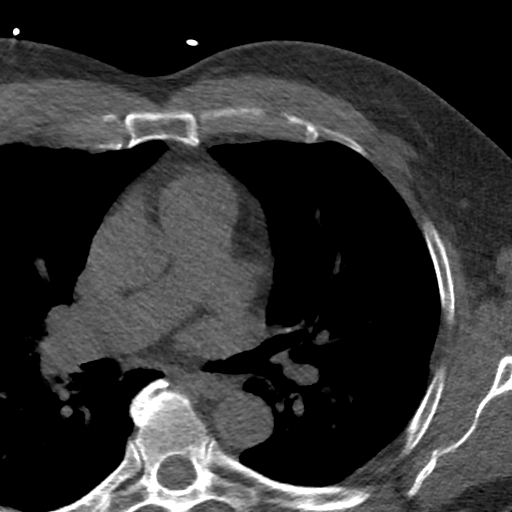

[Series 3: lung 73 % · axial · 0.70mm/px · z∈[-225,-141]mm · 5 of 43 slices shown]
[im 8/43  lung]
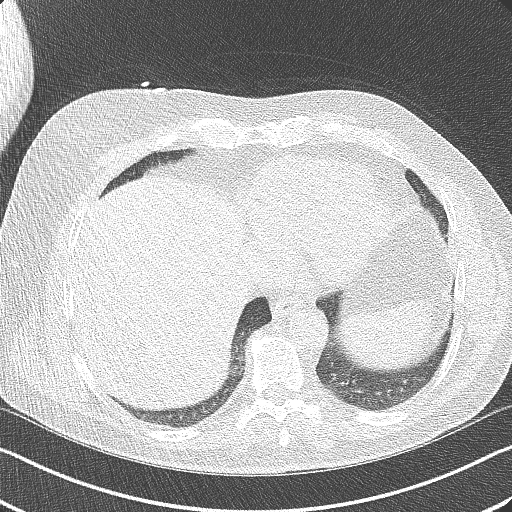
[im 15/43  lung]
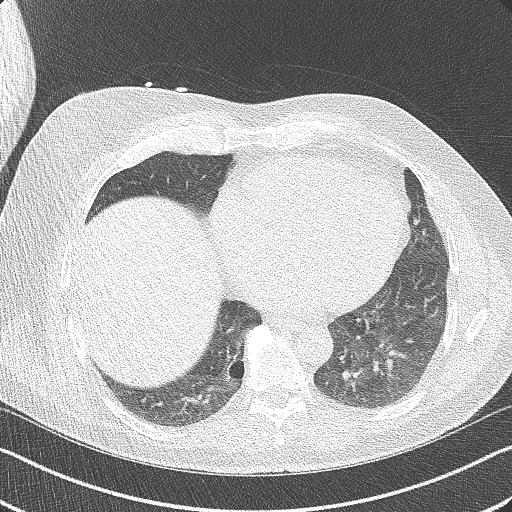
[im 22/43  lung]
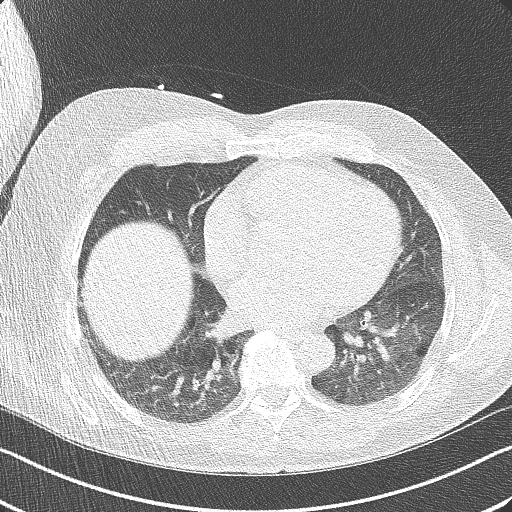
[im 29/43  lung]
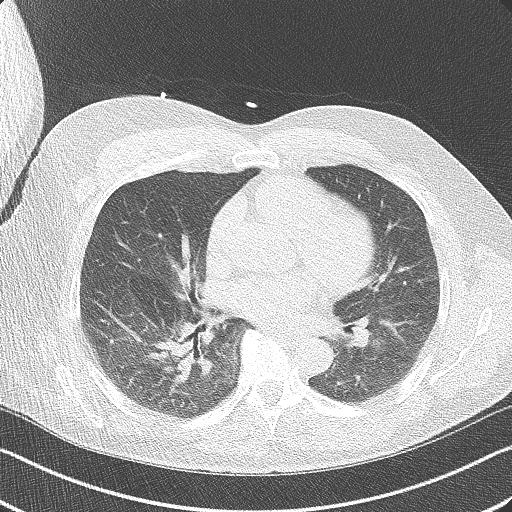
[im 36/43  lung]
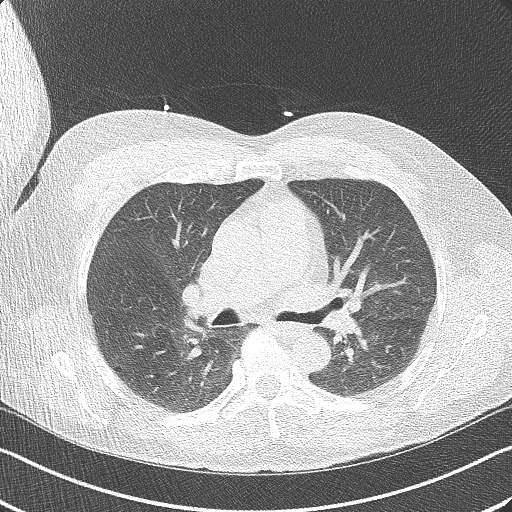

[Series 4: lung st 73 % · axial · 0.70mm/px · z∈[-225,-141]mm · 5 of 43 slices shown]
[im 8/43  lung]
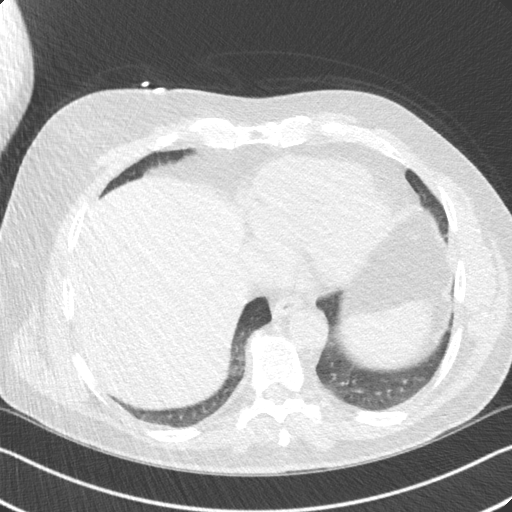
[im 15/43  lung]
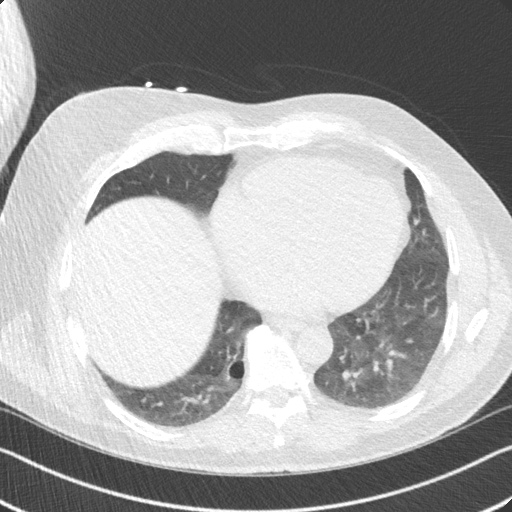
[im 22/43  lung]
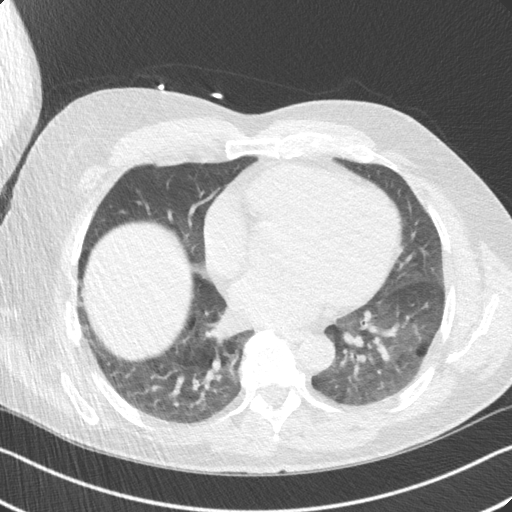
[im 29/43  lung]
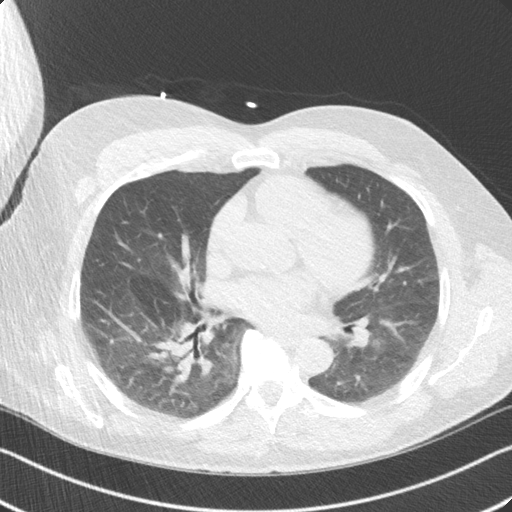
[im 36/43  lung]
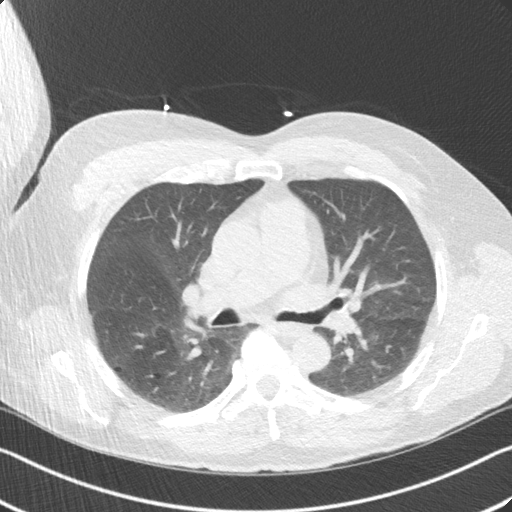

[14 of 20 positions shown; findings below may reference images not displayed]

FINDINGS: Vascular: Normal aortic caliber. Pulmonary artery enlargement,
outflow tract 3.1 cm.

Mediastinum/Nodes: No imaged thoracic adenopathy.

Lungs/Pleura: No pleural fluid. Minimal bulla in the lower lobes
bilaterally.

Upper Abdomen: Normal imaged portions of the liver, spleen, stomach.
Artifact degradation, primarily involving the upper abdomen,
secondary to patient's arm position, not raised above the head.

Musculoskeletal: Mid and lower thoracic spondylosis. Right
hemidiaphragm elevation is mild-to-moderate.
IMPRESSION: 1. No acute process in the imaged extracardiac chest.
2. Pulmonary artery enlargement suggests pulmonary arterial
hypertension.
FINDINGS: Coronary Calcium Score:

Left main: 0

Left anterior descending artery:

Left circumflex artery: 0

Right coronary artery: 0

Total:

Percentile: 24th

Pericardium: Normal.

Ascending Aorta: Normal caliber.

Non-cardiac: See separate report from [REDACTED].
IMPRESSION: Coronary calcium score of 6.1. This was 24th percentile for age-,
race-, and sex-matched controls.



If CAC=0, it is reasonable to withhold statin therapy and reassess
in 5 to 10 years, as long as higher risk conditions are absent
(diabetes mellitus, family history of premature CHD in first degree
relatives (males <55 years; females <65 years), cigarette smoking,
or LDL >=190 mg/dL).

If CAC is 1 to 99, it is reasonable to initiate statin therapy for
patients >=55 years of age.

If CAC is >=100 or >=75th percentile, it is reasonable to initiate
statin therapy at any age.

Cardiology referral should be considered for patients with CAC
scores >=400 or >=75th percentile.

*5671 AHA/ACC/AACVPR/AAPA/ABC/RONLOR/ERISYA/OJO/Ruman/AUJLA/HARMO/FARAHANA
Guideline on the Management of Blood Cholesterol: A Report of the
American College of Cardiology/American Heart Association Task Force
on Clinical Practice Guidelines. J Am Coll Cardiol.
1339;73(24):6259-6169.

*** End of Addendum ***
EXAM:
OVER-READ INTERPRETATION  CT CHEST

The following report is an over-read performed by radiologist Dr.
Suzan Jadhav [REDACTED] on 07/11/2021. This over-read
does not include interpretation of cardiac or coronary anatomy or
pathology. The calcium score interpretation by the cardiologist is
attached.
FINDINGS: Vascular: Normal aortic caliber. Pulmonary artery enlargement,
outflow tract 3.1 cm.

Mediastinum/Nodes: No imaged thoracic adenopathy.

Lungs/Pleura: No pleural fluid. Minimal bulla in the lower lobes
bilaterally.

Upper Abdomen: Normal imaged portions of the liver, spleen, stomach.
Artifact degradation, primarily involving the upper abdomen,
secondary to patient's arm position, not raised above the head.

Musculoskeletal: Mid and lower thoracic spondylosis. Right
hemidiaphragm elevation is mild-to-moderate.
IMPRESSION: 1. No acute process in the imaged extracardiac chest.
2. Pulmonary artery enlargement suggests pulmonary arterial
hypertension.

## 2022-05-27 DIAGNOSIS — Z125 Encounter for screening for malignant neoplasm of prostate: Secondary | ICD-10-CM | POA: Diagnosis not present

## 2022-05-27 DIAGNOSIS — Z0000 Encounter for general adult medical examination without abnormal findings: Secondary | ICD-10-CM | POA: Diagnosis not present

## 2022-05-27 DIAGNOSIS — E782 Mixed hyperlipidemia: Secondary | ICD-10-CM | POA: Diagnosis not present

## 2022-05-27 DIAGNOSIS — N529 Male erectile dysfunction, unspecified: Secondary | ICD-10-CM | POA: Diagnosis not present

## 2022-05-27 DIAGNOSIS — J309 Allergic rhinitis, unspecified: Secondary | ICD-10-CM | POA: Diagnosis not present

## 2022-05-27 DIAGNOSIS — F3340 Major depressive disorder, recurrent, in remission, unspecified: Secondary | ICD-10-CM | POA: Diagnosis not present

## 2022-07-15 DIAGNOSIS — M545 Low back pain, unspecified: Secondary | ICD-10-CM | POA: Diagnosis not present

## 2022-07-31 DIAGNOSIS — M5136 Other intervertebral disc degeneration, lumbar region: Secondary | ICD-10-CM | POA: Diagnosis not present

## 2022-07-31 DIAGNOSIS — M9902 Segmental and somatic dysfunction of thoracic region: Secondary | ICD-10-CM | POA: Diagnosis not present

## 2022-07-31 DIAGNOSIS — M9903 Segmental and somatic dysfunction of lumbar region: Secondary | ICD-10-CM | POA: Diagnosis not present

## 2022-07-31 DIAGNOSIS — M5134 Other intervertebral disc degeneration, thoracic region: Secondary | ICD-10-CM | POA: Diagnosis not present

## 2022-08-05 DIAGNOSIS — M9902 Segmental and somatic dysfunction of thoracic region: Secondary | ICD-10-CM | POA: Diagnosis not present

## 2022-08-05 DIAGNOSIS — M5136 Other intervertebral disc degeneration, lumbar region: Secondary | ICD-10-CM | POA: Diagnosis not present

## 2022-08-05 DIAGNOSIS — M9903 Segmental and somatic dysfunction of lumbar region: Secondary | ICD-10-CM | POA: Diagnosis not present

## 2022-08-05 DIAGNOSIS — M5134 Other intervertebral disc degeneration, thoracic region: Secondary | ICD-10-CM | POA: Diagnosis not present

## 2022-08-06 DIAGNOSIS — M5136 Other intervertebral disc degeneration, lumbar region: Secondary | ICD-10-CM | POA: Diagnosis not present

## 2022-08-06 DIAGNOSIS — M5134 Other intervertebral disc degeneration, thoracic region: Secondary | ICD-10-CM | POA: Diagnosis not present

## 2022-08-06 DIAGNOSIS — M9902 Segmental and somatic dysfunction of thoracic region: Secondary | ICD-10-CM | POA: Diagnosis not present

## 2022-08-06 DIAGNOSIS — M9903 Segmental and somatic dysfunction of lumbar region: Secondary | ICD-10-CM | POA: Diagnosis not present

## 2022-08-12 DIAGNOSIS — M5134 Other intervertebral disc degeneration, thoracic region: Secondary | ICD-10-CM | POA: Diagnosis not present

## 2022-08-12 DIAGNOSIS — M5136 Other intervertebral disc degeneration, lumbar region: Secondary | ICD-10-CM | POA: Diagnosis not present

## 2022-08-12 DIAGNOSIS — M9903 Segmental and somatic dysfunction of lumbar region: Secondary | ICD-10-CM | POA: Diagnosis not present

## 2022-08-12 DIAGNOSIS — M9902 Segmental and somatic dysfunction of thoracic region: Secondary | ICD-10-CM | POA: Diagnosis not present

## 2022-08-14 DIAGNOSIS — M5136 Other intervertebral disc degeneration, lumbar region: Secondary | ICD-10-CM | POA: Diagnosis not present

## 2022-08-14 DIAGNOSIS — M9903 Segmental and somatic dysfunction of lumbar region: Secondary | ICD-10-CM | POA: Diagnosis not present

## 2022-08-14 DIAGNOSIS — M5134 Other intervertebral disc degeneration, thoracic region: Secondary | ICD-10-CM | POA: Diagnosis not present

## 2022-08-14 DIAGNOSIS — M9902 Segmental and somatic dysfunction of thoracic region: Secondary | ICD-10-CM | POA: Diagnosis not present

## 2022-08-20 DIAGNOSIS — M9902 Segmental and somatic dysfunction of thoracic region: Secondary | ICD-10-CM | POA: Diagnosis not present

## 2022-08-20 DIAGNOSIS — M9903 Segmental and somatic dysfunction of lumbar region: Secondary | ICD-10-CM | POA: Diagnosis not present

## 2022-08-20 DIAGNOSIS — M5134 Other intervertebral disc degeneration, thoracic region: Secondary | ICD-10-CM | POA: Diagnosis not present

## 2022-08-20 DIAGNOSIS — M5136 Other intervertebral disc degeneration, lumbar region: Secondary | ICD-10-CM | POA: Diagnosis not present

## 2022-08-26 DIAGNOSIS — M5134 Other intervertebral disc degeneration, thoracic region: Secondary | ICD-10-CM | POA: Diagnosis not present

## 2022-08-26 DIAGNOSIS — M5136 Other intervertebral disc degeneration, lumbar region: Secondary | ICD-10-CM | POA: Diagnosis not present

## 2022-08-26 DIAGNOSIS — M9902 Segmental and somatic dysfunction of thoracic region: Secondary | ICD-10-CM | POA: Diagnosis not present

## 2022-08-26 DIAGNOSIS — M9903 Segmental and somatic dysfunction of lumbar region: Secondary | ICD-10-CM | POA: Diagnosis not present

## 2022-09-02 DIAGNOSIS — M5134 Other intervertebral disc degeneration, thoracic region: Secondary | ICD-10-CM | POA: Diagnosis not present

## 2022-09-02 DIAGNOSIS — M9903 Segmental and somatic dysfunction of lumbar region: Secondary | ICD-10-CM | POA: Diagnosis not present

## 2022-09-02 DIAGNOSIS — M9902 Segmental and somatic dysfunction of thoracic region: Secondary | ICD-10-CM | POA: Diagnosis not present

## 2022-09-02 DIAGNOSIS — M5136 Other intervertebral disc degeneration, lumbar region: Secondary | ICD-10-CM | POA: Diagnosis not present

## 2022-09-10 DIAGNOSIS — J209 Acute bronchitis, unspecified: Secondary | ICD-10-CM | POA: Diagnosis not present

## 2022-09-23 DIAGNOSIS — J40 Bronchitis, not specified as acute or chronic: Secondary | ICD-10-CM | POA: Diagnosis not present

## 2022-09-23 DIAGNOSIS — K219 Gastro-esophageal reflux disease without esophagitis: Secondary | ICD-10-CM | POA: Diagnosis not present

## 2023-01-13 DIAGNOSIS — K080 Exfoliation of teeth due to systemic causes: Secondary | ICD-10-CM | POA: Diagnosis not present

## 2023-04-08 DIAGNOSIS — H16223 Keratoconjunctivitis sicca, not specified as Sjogren's, bilateral: Secondary | ICD-10-CM | POA: Diagnosis not present

## 2023-07-28 DIAGNOSIS — K080 Exfoliation of teeth due to systemic causes: Secondary | ICD-10-CM | POA: Diagnosis not present

## 2023-08-04 DIAGNOSIS — E782 Mixed hyperlipidemia: Secondary | ICD-10-CM | POA: Diagnosis not present

## 2023-08-04 DIAGNOSIS — N529 Male erectile dysfunction, unspecified: Secondary | ICD-10-CM | POA: Diagnosis not present

## 2023-08-04 DIAGNOSIS — I700 Atherosclerosis of aorta: Secondary | ICD-10-CM | POA: Diagnosis not present

## 2023-08-04 DIAGNOSIS — Z0000 Encounter for general adult medical examination without abnormal findings: Secondary | ICD-10-CM | POA: Diagnosis not present

## 2023-08-04 DIAGNOSIS — F3340 Major depressive disorder, recurrent, in remission, unspecified: Secondary | ICD-10-CM | POA: Diagnosis not present

## 2023-08-04 DIAGNOSIS — J309 Allergic rhinitis, unspecified: Secondary | ICD-10-CM | POA: Diagnosis not present

## 2023-08-04 DIAGNOSIS — Z125 Encounter for screening for malignant neoplasm of prostate: Secondary | ICD-10-CM | POA: Diagnosis not present

## 2023-10-16 DIAGNOSIS — K635 Polyp of colon: Secondary | ICD-10-CM | POA: Diagnosis not present

## 2023-10-16 DIAGNOSIS — Z860100 Personal history of colon polyps, unspecified: Secondary | ICD-10-CM | POA: Diagnosis not present

## 2023-10-16 DIAGNOSIS — Z09 Encounter for follow-up examination after completed treatment for conditions other than malignant neoplasm: Secondary | ICD-10-CM | POA: Diagnosis not present

## 2023-10-16 DIAGNOSIS — K5730 Diverticulosis of large intestine without perforation or abscess without bleeding: Secondary | ICD-10-CM | POA: Diagnosis not present

## 2023-10-23 DIAGNOSIS — K635 Polyp of colon: Secondary | ICD-10-CM | POA: Diagnosis not present

## 2023-11-03 DIAGNOSIS — F3340 Major depressive disorder, recurrent, in remission, unspecified: Secondary | ICD-10-CM | POA: Diagnosis not present

## 2023-11-14 DIAGNOSIS — H524 Presbyopia: Secondary | ICD-10-CM | POA: Diagnosis not present

## 2024-01-26 DIAGNOSIS — L570 Actinic keratosis: Secondary | ICD-10-CM | POA: Diagnosis not present

## 2024-02-02 DIAGNOSIS — K080 Exfoliation of teeth due to systemic causes: Secondary | ICD-10-CM | POA: Diagnosis not present

## 2024-02-17 DIAGNOSIS — R5383 Other fatigue: Secondary | ICD-10-CM | POA: Diagnosis not present

## 2024-02-17 DIAGNOSIS — J3489 Other specified disorders of nose and nasal sinuses: Secondary | ICD-10-CM | POA: Diagnosis not present

## 2024-02-17 DIAGNOSIS — J019 Acute sinusitis, unspecified: Secondary | ICD-10-CM | POA: Diagnosis not present

## 2024-02-17 DIAGNOSIS — J309 Allergic rhinitis, unspecified: Secondary | ICD-10-CM | POA: Diagnosis not present

## 2024-02-25 DIAGNOSIS — M79671 Pain in right foot: Secondary | ICD-10-CM | POA: Diagnosis not present

## 2024-02-25 DIAGNOSIS — M7661 Achilles tendinitis, right leg: Secondary | ICD-10-CM | POA: Diagnosis not present

## 2024-02-25 DIAGNOSIS — M6701 Short Achilles tendon (acquired), right ankle: Secondary | ICD-10-CM | POA: Diagnosis not present

## 2024-03-11 DIAGNOSIS — M25571 Pain in right ankle and joints of right foot: Secondary | ICD-10-CM | POA: Diagnosis not present

## 2024-04-12 DIAGNOSIS — M6701 Short Achilles tendon (acquired), right ankle: Secondary | ICD-10-CM | POA: Diagnosis not present

## 2024-04-12 DIAGNOSIS — M7661 Achilles tendinitis, right leg: Secondary | ICD-10-CM | POA: Diagnosis not present

## 2024-05-17 DIAGNOSIS — M7661 Achilles tendinitis, right leg: Secondary | ICD-10-CM | POA: Diagnosis not present

## 2024-05-17 DIAGNOSIS — M6701 Short Achilles tendon (acquired), right ankle: Secondary | ICD-10-CM | POA: Diagnosis not present

## 2024-07-06 DIAGNOSIS — F3342 Major depressive disorder, recurrent, in full remission: Secondary | ICD-10-CM | POA: Diagnosis not present

## 2024-08-02 DIAGNOSIS — K080 Exfoliation of teeth due to systemic causes: Secondary | ICD-10-CM | POA: Diagnosis not present

## 2024-08-30 DIAGNOSIS — M542 Cervicalgia: Secondary | ICD-10-CM | POA: Diagnosis not present

## 2024-09-07 DIAGNOSIS — M1711 Unilateral primary osteoarthritis, right knee: Secondary | ICD-10-CM | POA: Diagnosis not present

## 2024-09-13 DIAGNOSIS — E782 Mixed hyperlipidemia: Secondary | ICD-10-CM | POA: Diagnosis not present

## 2024-09-13 DIAGNOSIS — Z125 Encounter for screening for malignant neoplasm of prostate: Secondary | ICD-10-CM | POA: Diagnosis not present

## 2024-09-13 DIAGNOSIS — F3340 Major depressive disorder, recurrent, in remission, unspecified: Secondary | ICD-10-CM | POA: Diagnosis not present

## 2024-09-13 DIAGNOSIS — Z23 Encounter for immunization: Secondary | ICD-10-CM | POA: Diagnosis not present

## 2024-09-13 DIAGNOSIS — N529 Male erectile dysfunction, unspecified: Secondary | ICD-10-CM | POA: Diagnosis not present

## 2024-09-13 DIAGNOSIS — Z0000 Encounter for general adult medical examination without abnormal findings: Secondary | ICD-10-CM | POA: Diagnosis not present

## 2024-09-13 DIAGNOSIS — J309 Allergic rhinitis, unspecified: Secondary | ICD-10-CM | POA: Diagnosis not present

## 2024-09-27 DIAGNOSIS — M542 Cervicalgia: Secondary | ICD-10-CM | POA: Diagnosis not present

## 2024-10-21 DIAGNOSIS — M542 Cervicalgia: Secondary | ICD-10-CM | POA: Diagnosis not present

## 2024-10-21 DIAGNOSIS — M62838 Other muscle spasm: Secondary | ICD-10-CM | POA: Diagnosis not present

## 2024-11-05 DIAGNOSIS — J302 Other seasonal allergic rhinitis: Secondary | ICD-10-CM | POA: Diagnosis not present

## 2024-11-05 DIAGNOSIS — H9201 Otalgia, right ear: Secondary | ICD-10-CM | POA: Diagnosis not present

## 2024-12-22 ENCOUNTER — Ambulatory Visit (INDEPENDENT_AMBULATORY_CARE_PROVIDER_SITE_OTHER)

## 2024-12-22 ENCOUNTER — Encounter: Payer: Self-pay | Admitting: Podiatry

## 2024-12-22 ENCOUNTER — Ambulatory Visit: Payer: Self-pay | Admitting: Podiatry

## 2024-12-22 VITALS — Ht 71.0 in | Wt 191.8 lb

## 2024-12-22 DIAGNOSIS — M7751 Other enthesopathy of right foot: Secondary | ICD-10-CM

## 2024-12-22 DIAGNOSIS — M7661 Achilles tendinitis, right leg: Secondary | ICD-10-CM

## 2024-12-22 MED ORDER — METHYLPREDNISOLONE 4 MG PO TBPK: ORAL_TABLET | ORAL | 0 refills | Status: AC

## 2024-12-22 MED ORDER — MELOXICAM 15 MG PO TABS: 15.0000 mg | ORAL_TABLET | Freq: Every day | ORAL | 1 refills | Status: AC

## 2024-12-22 MED ORDER — BETAMETHASONE SOD PHOS & ACET 6 (3-3) MG/ML IJ SUSP
3.0000 mg | Freq: Once | INTRAMUSCULAR | Status: AC
  Administered 2024-12-22: 3 mg via INTRA_ARTICULAR

## 2024-12-22 NOTE — Progress Notes (Signed)
" ° °  No chief complaint on file.   HPI: 69 y.o. malepresenting today as a new patient and second opinion for insertional Achilles tendinitis to the right posterior heel.    Brief history: Ongoing for over 6 months now.  He has been seen and managed at [facility] over the past 6 months with Dr. Kit.  There has been no improvement.  He took a few weeks of oral meloxicam  and was placed in a cam boot.  Surgery was ultimately recommended. he presents for second opinion and further evaluation  Past Medical History:  Diagnosis Date   Abdominal aortic atherosclerosis 2022   Borderline hypercholesterolemia    GERD (gastroesophageal reflux disease)    RBBB 2014    Past Surgical History:  Procedure Laterality Date   Right inguinal hernia repair Right     Allergies[1]   Physical Exam: General: The patient is alert and oriented x3 in no acute distress.  Dermatology: Skin is warm, dry and supple bilateral lower extremities.   Vascular: Palpable pedal pulses bilaterally. Capillary refill within normal limits.  No appreciable edema.  No erythema.  Neurological: Grossly intact via light touch  Musculoskeletal Exam: Haglund deformity noted right lower extremity with associated tenderness to palpation along the medial calcaneal tubercle at the insertion of the Achilles tendon  Radiographic Exam RT ankle 12/22/2024:  Normal osseous mineralization. Joint spaces preserved.  No fractures or irregularities noted.  Posterior heel spur noted on lateral view  Assessment/Plan of Care: 1.  Insertional Achilles tendinitis right 2.  Haglund deformity right  -Patient evaluated.  X-rays reviewed -Discussed additional treatment modalities and options including physical therapy, EPAT, and ultimately surgery.  For now the patient would like to continue to pursue conservative treatment and care -Injection of 0.5 cc Celestone  Soluspan injected around the medial aspect of the posterior calcaneal tubercle.  Care  taken to avoid direct injection to the Achilles tendon -Prescription for Medrol  Dosepak -Prescription for meloxicam  15 mg daily -Continue physical therapy exercises at home -Return to clinic in 4 weeks.  He will also make an appointment on the nurse schedule for EPAT/shockwave if there is no improvement he will pursue this option     Thresa EMERSON Sar, DPM [facility]  Dr. Thresa EMERSON Sar, DPM    [address] [place], [place] [address]                Office [phone]  Fax [phone]        [1] No Known Allergies  "

## 2025-01-19 ENCOUNTER — Encounter: Payer: Self-pay | Admitting: Podiatry

## 2025-01-19 ENCOUNTER — Ambulatory Visit: Admitting: Podiatry

## 2025-01-19 VITALS — Ht 71.0 in | Wt 191.8 lb

## 2025-01-19 DIAGNOSIS — M7661 Achilles tendinitis, right leg: Secondary | ICD-10-CM | POA: Diagnosis not present

## 2025-01-19 NOTE — Progress Notes (Signed)
" ° °  Chief Complaint  Patient presents with   Ankle Pain    Pt is here to f/u on right ankle due to achilles tendinitis, states he still has some discomfort but pain is 75-80% better.    HPI: 69 y.o. malepresenting today for follow-up evaluation of Achilles tendinitis to the right lower extremity.  Overall he states that he is significantly better.  The pain is essentially a 1 out of 10.  Overall he is very satisfied with the improvement that he has had just since the last visit.  He continues to take the meloxicam  daily without complications or issues  Brief history: Ongoing for over 6 months now.  He has been seen and managed at [facility] over the past 6 months with Dr. Kit.  There has been no improvement.  He took a few weeks of oral meloxicam  and was placed in a cam boot.  Surgery was ultimately recommended. he presents for second opinion and further evaluation  Past Medical History:  Diagnosis Date   Abdominal aortic atherosclerosis 2022   Borderline hypercholesterolemia    GERD (gastroesophageal reflux disease)    RBBB 2014    Past Surgical History:  Procedure Laterality Date   Right inguinal hernia repair Right     Allergies[1]   Physical Exam: General: The patient is alert and oriented x3 in no acute distress.  Dermatology: Skin is warm, dry and supple bilateral lower extremities.   Vascular: Palpable pedal pulses bilaterally. Capillary refill within normal limits.  No appreciable edema.  No erythema.  Neurological: Grossly intact via light touch  Musculoskeletal Exam: Haglund deformity noted right lower extremity.  There is no tenderness to palpation along the medial calcaneal tubercle at the insertion of the Achilles tendon today  Radiographic Exam RT ankle 12/22/2024:  Normal osseous mineralization. Joint spaces preserved.  No fractures or irregularities noted.  Posterior heel spur noted on lateral view  Assessment/Plan of Care: 1.  Insertional Achilles tendinitis  right 2.  Haglund deformity right  -Patient evaluated.   - Overall significant improvement.  He is very satisfied. -Continue meloxicam  15 mg daily PRN -Continue physical therapy exercises at home - Return to clinic PRN.  If he does notice a slow recurrence of the pain he may also consider calling our office for an appointment on the nurse schedule for EPAT/shockwave     Thresa EMERSON Sar, DPM [facility]  Dr. Thresa EMERSON Sar, DPM    [address] [place], [place] [address]                Office [phone]  Fax [phone]        [1] No Known Allergies  "

## 2025-02-11 ENCOUNTER — Ambulatory Visit
